# Patient Record
Sex: Male | Born: 2000 | Race: Black or African American | Hispanic: No | Marital: Single | Smoking: Never smoker
Health system: Southern US, Community
[De-identification: ages and names within clinical notes are randomized; demographics above are authoritative.]

---
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
## 2021-09-27 IMAGING — CR DG KNEE COMPLETE 4+V*L*
4 series · 4 of 4 positions shown · non-contrast
Comparison: None.

CLINICAL DATA: Pain

EXAM:
LEFT KNEE - COMPLETE 4+ VIEW

[knee ap]
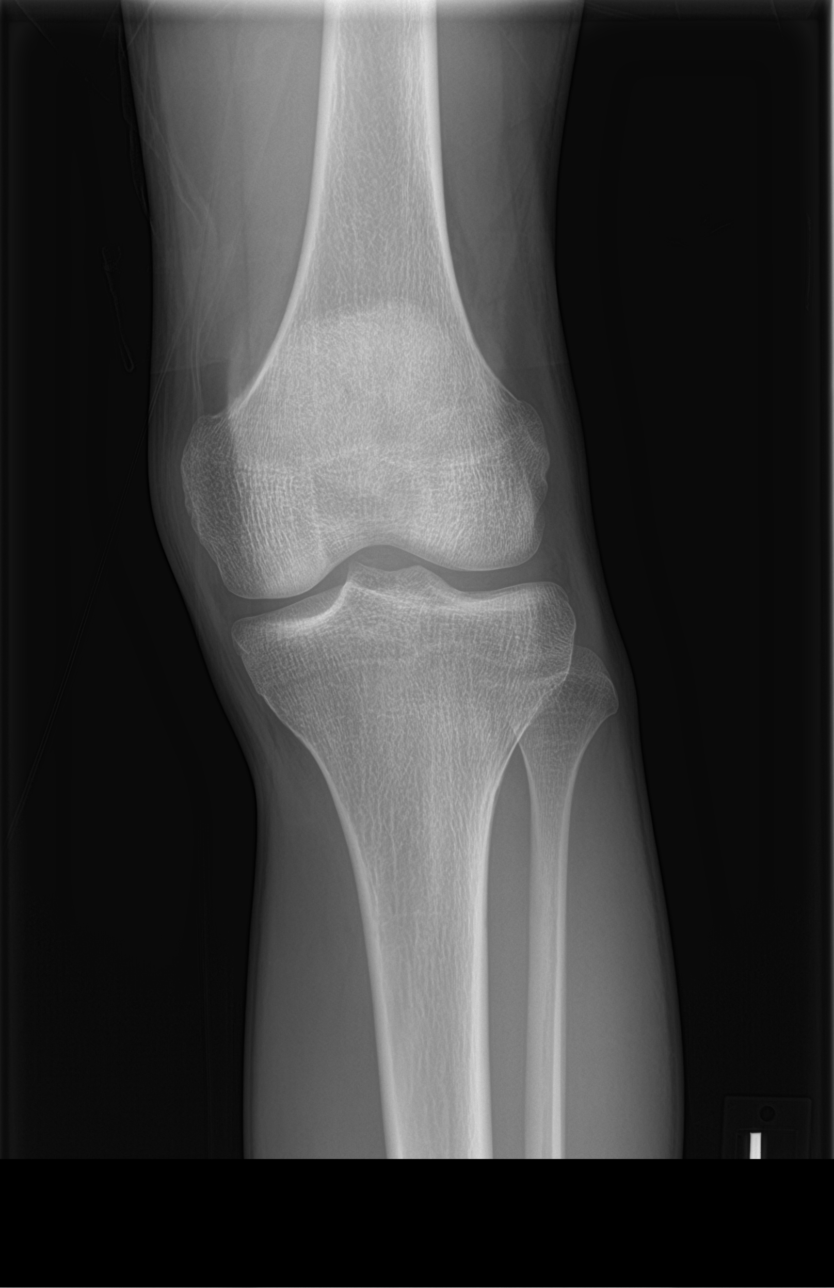

[knee lat]
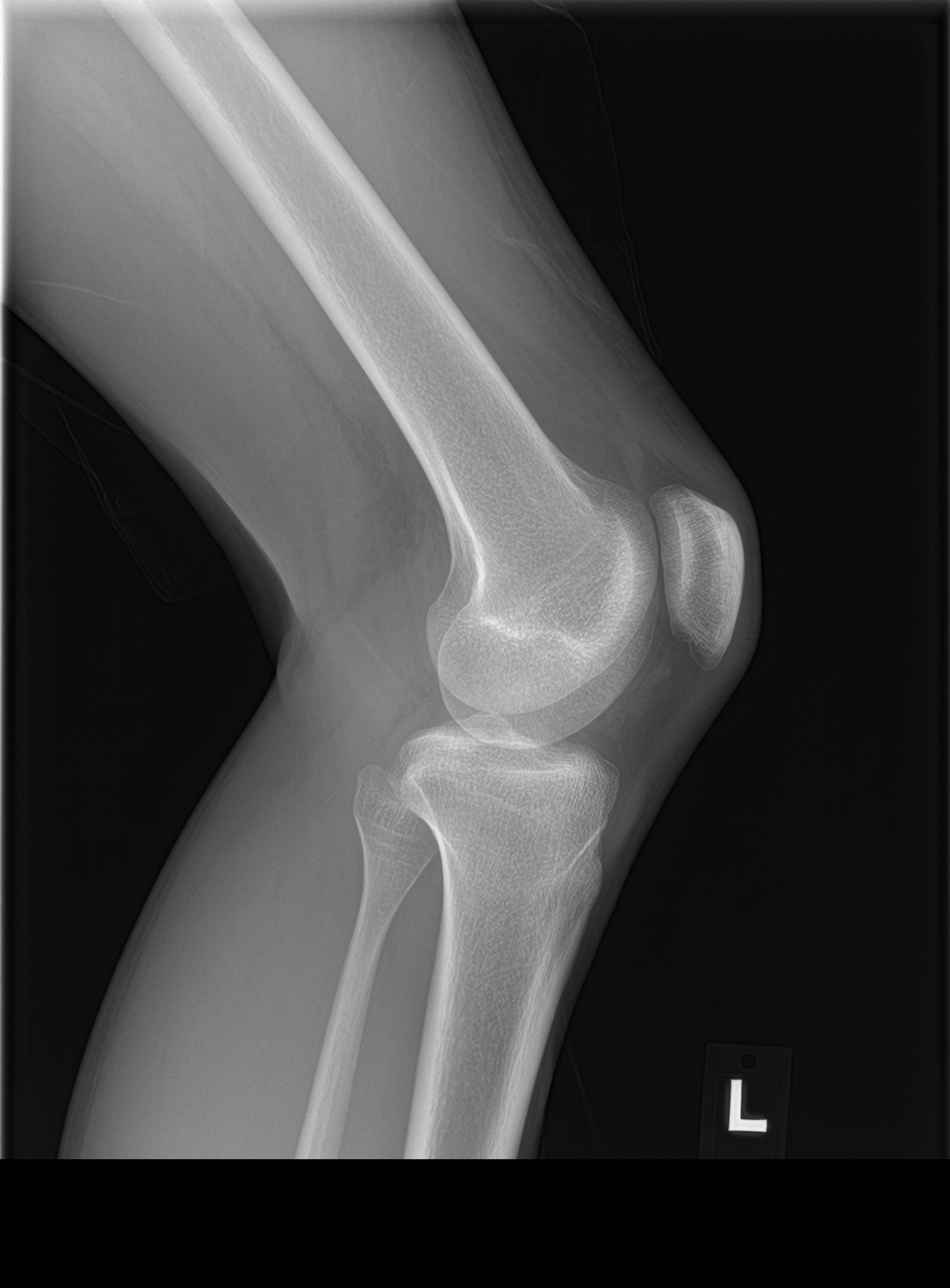

[knee obl (1 of 2)]
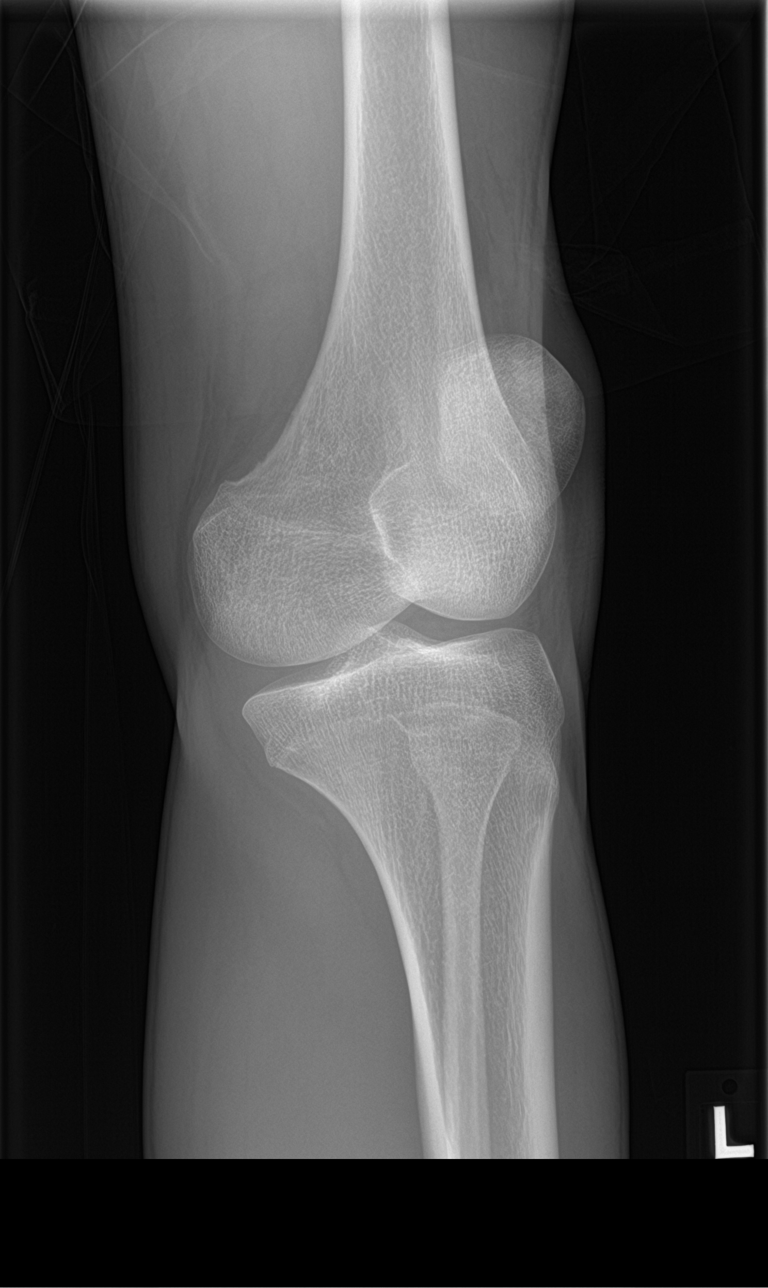

[knee obl (2 of 2)]
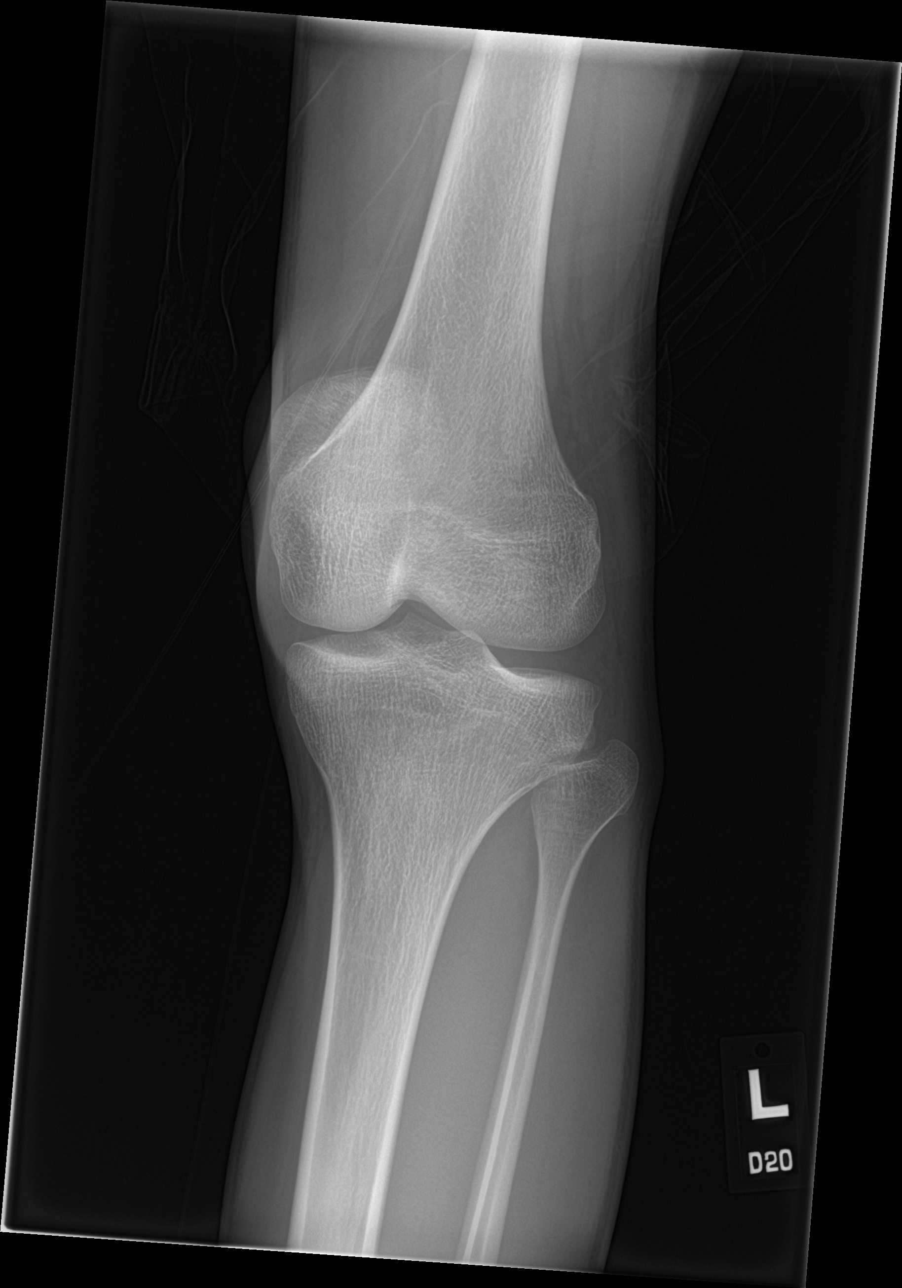

[4 of 4 positions shown; findings below may reference images not displayed]

FINDINGS: No evidence of fracture, dislocation, or joint effusion. No evidence
of arthropathy or other focal bone abnormality. Soft tissues are
unremarkable.
IMPRESSION: Negative.

## 2021-09-27 IMAGING — CR DG WRIST COMPLETE 3+V*L*
4 series · 4 of 4 positions shown · non-contrast
Comparison: None.

CLINICAL DATA: Pain

EXAM:
LEFT WRIST - COMPLETE 3+ VIEW

[wrist pa]
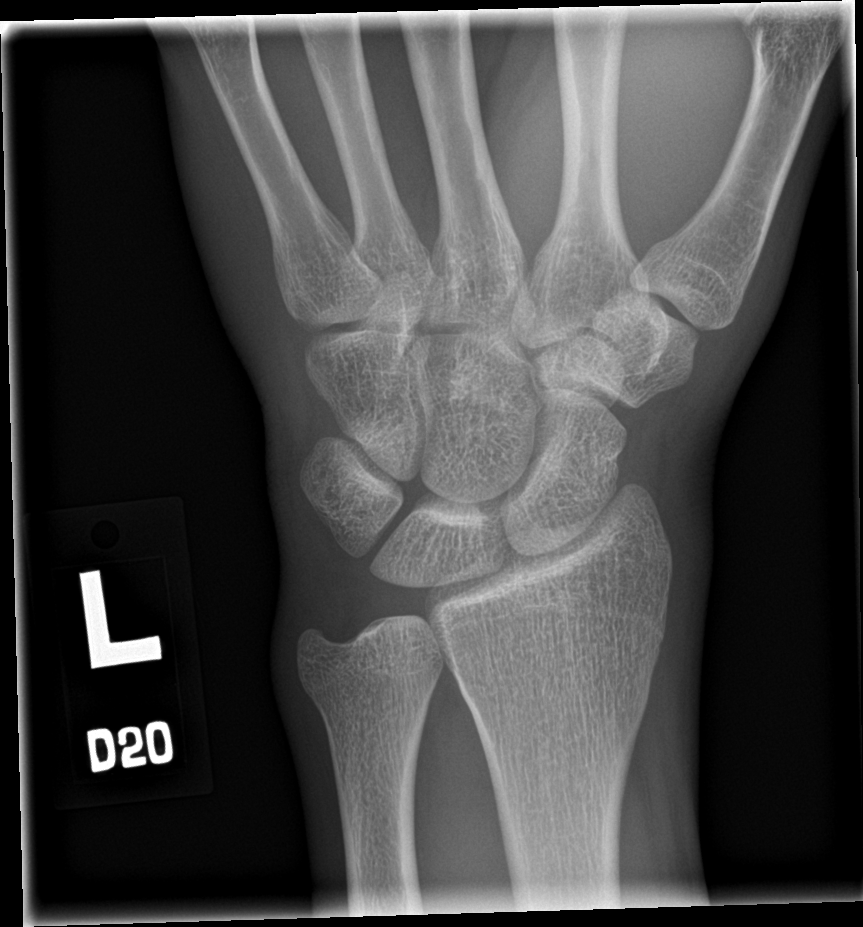

[wrist obl]
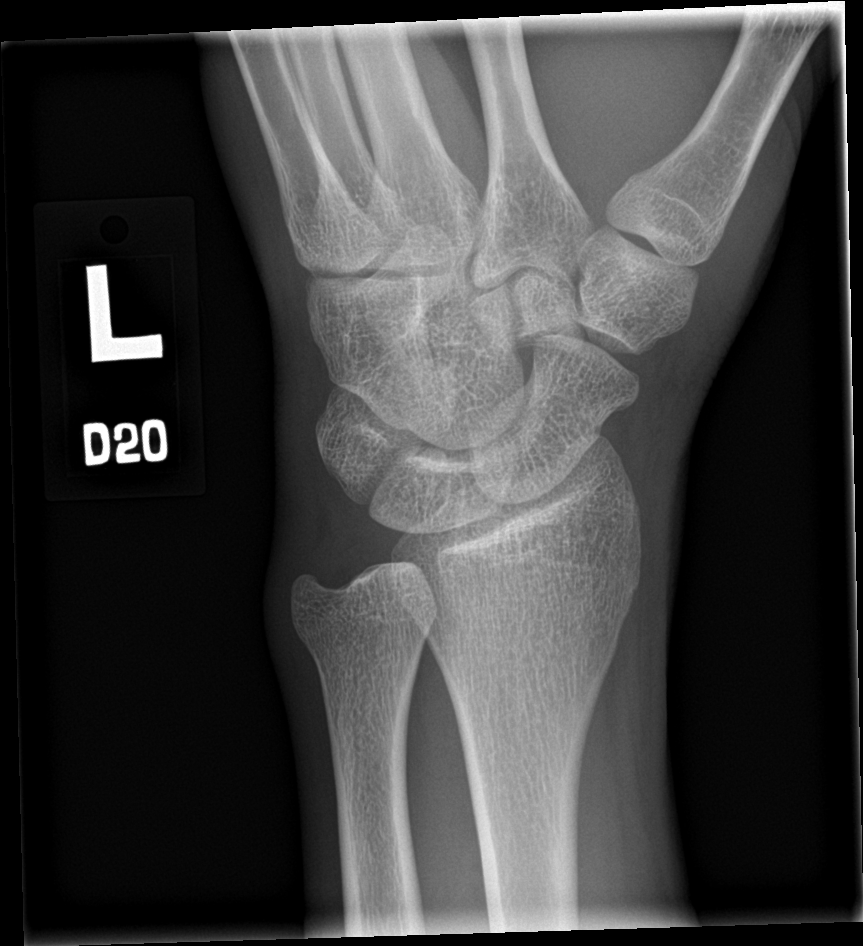

[wrist lat]
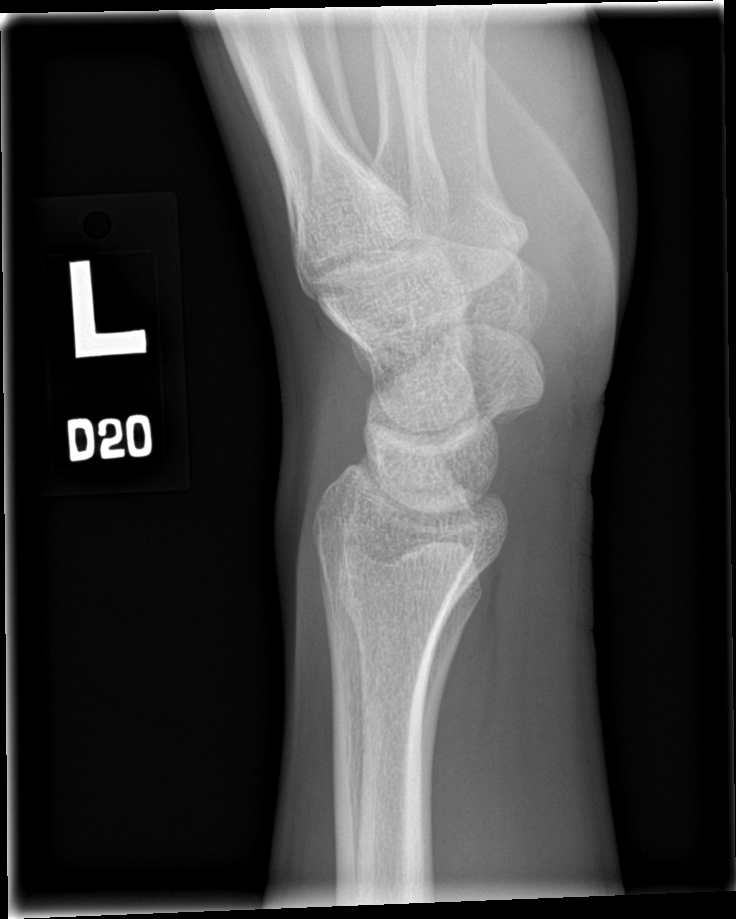

[wrist navicular]
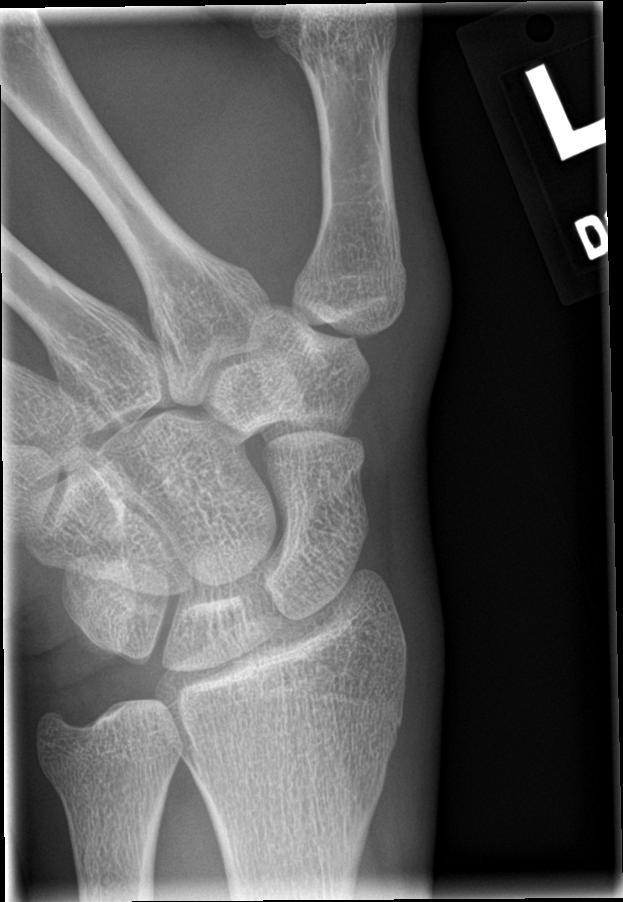

[4 of 4 positions shown; findings below may reference images not displayed]

FINDINGS: There is no evidence of fracture or dislocation. There is no
evidence of arthropathy or other focal bone abnormality. Soft
tissues are unremarkable.
IMPRESSION: Negative.
